# Patient Record
Sex: Female | Born: 1995 | Race: Black or African American | Hispanic: No | Marital: Single | Smoking: Current every day smoker
Health system: Southern US, Community
[De-identification: ages and names within clinical notes are randomized; demographics above are authoritative.]

---
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
## 2016-03-27 ENCOUNTER — Encounter (HOSPITAL_BASED_OUTPATIENT_CLINIC_OR_DEPARTMENT_OTHER): Payer: Self-pay | Admitting: Emergency Medicine

## 2016-03-27 ENCOUNTER — Emergency Department (HOSPITAL_BASED_OUTPATIENT_CLINIC_OR_DEPARTMENT_OTHER)
Admission: EM | Admit: 2016-03-27 | Discharge: 2016-03-27 | Disposition: A | Discharge status: Home / Self Care | Payer: Self-pay | Attending: Emergency Medicine | Admitting: Emergency Medicine

## 2016-03-27 DIAGNOSIS — E860 Dehydration: Secondary | ICD-10-CM

## 2016-03-27 DIAGNOSIS — F17200 Nicotine dependence, unspecified, uncomplicated: Secondary | ICD-10-CM | POA: Insufficient documentation

## 2016-03-27 DIAGNOSIS — T5191XA Toxic effect of unspecified alcohol, accidental (unintentional), initial encounter: Secondary | ICD-10-CM

## 2016-03-27 LAB — PREGNANCY, URINE: Preg Test, Ur: NEGATIVE

## 2016-03-27 MED ORDER — KETOROLAC TROMETHAMINE 30 MG/ML IJ SOLN
30.0000 mg | Freq: Once | INTRAMUSCULAR | Status: AC
  Administered 2016-03-27: 30 mg via INTRAVENOUS
  Filled 2016-03-27: qty 1

## 2016-03-27 MED ORDER — SODIUM CHLORIDE 0.9 % IV BOLUS (SEPSIS)
1000.0000 mL | Freq: Once | INTRAVENOUS | Status: AC
  Administered 2016-03-27: 1000 mL via INTRAVENOUS

## 2016-03-27 MED ORDER — ONDANSETRON HCL 4 MG/2ML IJ SOLN
4.0000 mg | Freq: Once | INTRAMUSCULAR | Status: AC
  Administered 2016-03-27: 4 mg via INTRAVENOUS
  Filled 2016-03-27: qty 2
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
## 2016-03-27 NOTE — ED Provider Notes (Signed)
[facility]   CSN: [id number] Arrival date & time: 03/27/16  1414     History   Chief Complaint Chief Complaint  Patient presents with  . Weakness    HPI Susan Rosales is a 20 y.o. female.  The history is provided by the patient. No language interpreter was used.  Weakness  Primary symptoms include dizziness. This is a new problem. The current episode started 12 to 24 hours ago. The problem has been gradually worsening. There was no focality noted. There has been no fever. Pertinent negatives include no shortness of breath. There were no medications administered prior to arrival.  Pt complains of weakness after drinking to much alcohol last pm.  Pt reports she vomitted several times  History reviewed. No pertinent past medical history.  There are no active problems to display for this patient.   History reviewed. No pertinent surgical history.  OB History    No data available       Home Medications    Prior to Admission medications   Not on File    Family History History reviewed. No pertinent family history.  Social History Social History  Substance Use Topics  . Smoking status: Current Every Day Smoker  . Smokeless tobacco: Not on file  . Alcohol use Yes     Allergies   Review of patient's allergies indicates no known allergies.   Review of Systems Review of Systems  Respiratory: Negative for shortness of breath.   Neurological: Positive for dizziness and weakness.  All other systems reviewed and are negative.    Physical Exam Updated Vital Signs BP 124/75 (BP Location: Left Arm)   Pulse 81   Temp 98.1 F (36.7 C) (Oral)   Resp 18   Ht 5\' 5"  (1.651 m)   Wt 70.3 kg   LMP 03/10/2016 (Approximate)   SpO2 95%   BMI 25.79 kg/m   Physical Exam  Constitutional: She appears well-developed and well-nourished. No distress.  HENT:  Head: Normocephalic and atraumatic.  Eyes: Conjunctivae are normal.  Neck: Neck  supple.  Cardiovascular: Normal rate and regular rhythm.   No murmur heard. Pulmonary/Chest: Effort normal and breath sounds normal. No respiratory distress.  Abdominal: Soft. There is no tenderness.  Musculoskeletal: She exhibits no edema.  Neurological: She is alert.  Skin: Skin is warm and dry.  Psychiatric: She has a normal mood and affect.  Nursing note and vitals reviewed.    ED Treatments / Results  Labs (all labs ordered are listed, but only abnormal results are displayed) Labs Reviewed  PREGNANCY, URINE    EKG  EKG Interpretation None       Radiology No results found.  Procedures Procedures (including critical care time)  Medications Ordered in ED Medications  sodium chloride 0.9 % bolus 1,000 mL (not administered)  ondansetron (ZOFRAN) injection 4 mg (not administered)  ketorolac (TORADOL) 30 MG/ML injection 30 mg (not administered)     Initial Impression / Assessment and Plan / ED Course  I have reviewed the triage vital signs and the nursing notes.  Pertinent labs & imaging results that were available during my care of the patient were reviewed by me and considered in my medical decision making (see chart for details).  Clinical Course    Pt given iv fluids x 1 liter,  zofran and torodol.   Final Clinical Impressions(s) / ED Diagnoses   Final diagnoses:  Dehydration  Alcohol causing toxic effect, initial encounter    New  Prescriptions New Prescriptions   No medications on file     Elson Areas, [place] 03/27/16 1607    Jacalyn Lefevre, MD 03/28/16 [phone]
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
## 2016-03-27 NOTE — ED Notes (Signed)
Pt given ginger ale.

## 2016-08-11 ENCOUNTER — Emergency Department (HOSPITAL_BASED_OUTPATIENT_CLINIC_OR_DEPARTMENT_OTHER)
Admission: EM | Admit: 2016-08-11 | Discharge: 2016-08-12 | Disposition: A | Discharge status: Home / Self Care | Payer: Self-pay | Attending: Emergency Medicine | Admitting: Emergency Medicine

## 2016-08-11 ENCOUNTER — Encounter (HOSPITAL_BASED_OUTPATIENT_CLINIC_OR_DEPARTMENT_OTHER): Payer: Self-pay | Admitting: Emergency Medicine

## 2016-08-11 DIAGNOSIS — R05 Cough: Secondary | ICD-10-CM | POA: Insufficient documentation

## 2016-08-11 DIAGNOSIS — R1110 Vomiting, unspecified: Secondary | ICD-10-CM | POA: Insufficient documentation

## 2016-08-11 DIAGNOSIS — R69 Illness, unspecified: Secondary | ICD-10-CM

## 2016-08-11 DIAGNOSIS — R52 Pain, unspecified: Secondary | ICD-10-CM | POA: Insufficient documentation

## 2016-08-11 DIAGNOSIS — J111 Influenza due to unidentified influenza virus with other respiratory manifestations: Secondary | ICD-10-CM

## 2016-08-11 DIAGNOSIS — F17200 Nicotine dependence, unspecified, uncomplicated: Secondary | ICD-10-CM | POA: Insufficient documentation

## 2016-08-11 DIAGNOSIS — R509 Fever, unspecified: Secondary | ICD-10-CM | POA: Insufficient documentation

## 2016-08-11 DIAGNOSIS — R0981 Nasal congestion: Secondary | ICD-10-CM | POA: Insufficient documentation

## 2016-08-11 LAB — PREGNANCY, URINE: Preg Test, Ur: NEGATIVE

## 2016-08-11 LAB — URINALYSIS, MICROSCOPIC (REFLEX)

## 2016-08-11 LAB — URINALYSIS, ROUTINE W REFLEX MICROSCOPIC
Bilirubin Urine: NEGATIVE
GLUCOSE, UA: NEGATIVE mg/dL
HGB URINE DIPSTICK: NEGATIVE
Ketones, ur: NEGATIVE mg/dL
Nitrite: NEGATIVE
PH: 6.5 (ref 5.0–8.0)
Protein, ur: NEGATIVE mg/dL
SPECIFIC GRAVITY, URINE: 1.025 (ref 1.005–1.030)

## 2016-08-11 NOTE — ED Triage Notes (Signed)
Pt with cough, congestion, body aches, vomiting.

## 2016-08-11 NOTE — ED Notes (Signed)
Pt also states she had been having urinary frequency and is concerned she may have a UTI.

## 2016-08-12 MED ORDER — IBUPROFEN 800 MG PO TABS: 800.0000 mg | ORAL_TABLET | Freq: Three times a day (TID) | ORAL | 0 refills | Status: DC

## 2016-08-12 MED ORDER — BENZONATATE 100 MG PO CAPS: 100.0000 mg | ORAL_CAPSULE | Freq: Three times a day (TID) | ORAL | 0 refills | Status: DC

## 2016-08-12 NOTE — ED Provider Notes (Signed)
[facility]   CSN: [id number] Arrival date & time: 08/11/16  2045     History   Chief Complaint Chief Complaint  Patient presents with  . URI    HPI Susan Rosales is a 21 y.o. female.  The history is provided by the patient.  URI   This is a new problem. The current episode started more than 2 days ago. The problem has been gradually worsening. The maximum temperature recorded prior to her arrival was 100 to 100.9 F. Pertinent negatives include no chest pain and no diarrhea. She has tried nothing for the symptoms. The treatment provided no relief.  Pt concerned about uti as well  History reviewed. No pertinent past medical history.  There are no active problems to display for this patient.   History reviewed. No pertinent surgical history.  OB History    No data available       Home Medications    Prior to Admission medications   Medication Sig Start Date End Date Taking? Authorizing Provider  benzonatate (TESSALON) 100 MG capsule Take 1 capsule (100 mg total) by mouth every 8 (eight) hours. 08/12/16   Elson Areas, PA-C  ibuprofen (ADVIL,MOTRIN) 800 MG tablet Take 1 tablet (800 mg total) by mouth 3 (three) times daily. 08/12/16   Elson Areas, PA-C    Family History No family history on file.  Social History Social History  Substance Use Topics  . Smoking status: Current Every Day Smoker  . Smokeless tobacco: Never Used  . Alcohol use Yes     Allergies   Patient has no known allergies.   Review of Systems Review of Systems  Cardiovascular: Negative for chest pain.  Gastrointestinal: Negative for diarrhea.  All other systems reviewed and are negative.    Physical Exam Updated Vital Signs BP 113/77   Pulse 83   Temp 98.2 F (36.8 C)   Resp 18   Ht 5\' 5"  (1.651 m)   Wt 65.8 kg   SpO2 99%   BMI 24.13 kg/m   Physical Exam  Constitutional: She appears well-developed and well-nourished. No distress.  HENT:    Head: Normocephalic and atraumatic.  Right Ear: External ear normal.  Left Ear: External ear normal.  Nose: Nose normal.  Mouth/Throat: Oropharynx is clear and moist.  Eyes: Conjunctivae are normal.  Neck: Neck supple.  Cardiovascular: Normal rate and regular rhythm.   No murmur heard. Pulmonary/Chest: Effort normal and breath sounds normal. No respiratory distress.  Abdominal: Soft. There is no tenderness.  Musculoskeletal: Normal range of motion. She exhibits no edema.  Neurological: She is alert.  Skin: Skin is warm and dry.  Psychiatric: She has a normal mood and affect.  Nursing note and vitals reviewed.    ED Treatments / Results  Labs (all labs ordered are listed, but only abnormal results are displayed) Labs Reviewed  URINALYSIS, ROUTINE W REFLEX MICROSCOPIC - Abnormal; Notable for the following:       Result Value   Leukocytes, UA TRACE (*)    All other components within normal limits  URINALYSIS, MICROSCOPIC (REFLEX) - Abnormal; Notable for the following:    Bacteria, UA MANY (*)    Squamous Epithelial / LPF 6-30 (*)    All other components within normal limits  PREGNANCY, URINE    EKG  EKG Interpretation None       Radiology No results found.  Procedures Procedures (including critical care time)  Medications Ordered in ED Medications -  No data to display   Initial Impression / Assessment and Plan / ED Course  I have reviewed the triage vital signs and the nursing notes.  Pertinent labs & imaging results that were available during my care of the patient were reviewed by me and considered in my medical decision making (see chart for details).       Final Clinical Impressions(s) / ED Diagnoses   Final diagnoses:  Influenza-like illness    New Prescriptions Discharge Medication List as of 08/12/2016 12:42 AM    START taking these medications   Details  benzonatate (TESSALON) 100 MG capsule Take 1 capsule (100 mg total) by mouth every 8  (eight) hours., Starting Thu 08/12/2016, Print    ibuprofen (ADVIL,MOTRIN) 800 MG tablet Take 1 tablet (800 mg total) by mouth 3 (three) times daily., Starting Thu 08/12/2016, Print      An After Visit Summary was printed and given to the patient.   Lonia SkinnerLeslie K CaneySofia, PA-C 08/12/16 0159    Paula LibraJohn Molpus, MD 08/12/16 [phone]

## 2016-08-12 NOTE — ED Notes (Signed)
ED Provider at bedside. 

## 2017-01-21 ENCOUNTER — Encounter (HOSPITAL_BASED_OUTPATIENT_CLINIC_OR_DEPARTMENT_OTHER): Payer: Self-pay | Admitting: *Deleted

## 2017-01-21 ENCOUNTER — Emergency Department (HOSPITAL_BASED_OUTPATIENT_CLINIC_OR_DEPARTMENT_OTHER)
Admission: EM | Admit: 2017-01-21 | Discharge: 2017-01-21 | Disposition: A | Discharge status: Home / Self Care | Payer: Self-pay | Attending: Emergency Medicine | Admitting: Emergency Medicine

## 2017-01-21 DIAGNOSIS — K047 Periapical abscess without sinus: Secondary | ICD-10-CM | POA: Insufficient documentation

## 2017-01-21 DIAGNOSIS — Z79899 Other long term (current) drug therapy: Secondary | ICD-10-CM | POA: Insufficient documentation

## 2017-01-21 DIAGNOSIS — F17290 Nicotine dependence, other tobacco product, uncomplicated: Secondary | ICD-10-CM | POA: Insufficient documentation

## 2017-01-21 DIAGNOSIS — K029 Dental caries, unspecified: Secondary | ICD-10-CM | POA: Insufficient documentation

## 2017-01-21 MED ORDER — MELOXICAM 15 MG PO TABS: 15.0000 mg | ORAL_TABLET | Freq: Every day | ORAL | 0 refills | Status: DC

## 2017-01-21 MED ORDER — AMOXICILLIN 500 MG PO CAPS: 500.0000 mg | ORAL_CAPSULE | Freq: Three times a day (TID) | ORAL | 0 refills | Status: DC

## 2017-01-21 NOTE — Discharge Instructions (Signed)
Do not take the pain medicine if you are pregnant. Take tylenol.  You have been seen by your caregiver because of dental pain.  SEEK MEDICAL ATTENTION IF: The exam and treatment you received today has been provided on an emergency basis only. This is not a substitute for complete medical or dental care. If your problem worsens or new symptoms (problems) appear, and you are unable to arrange prompt follow-up care with your dentist, call or return to this location. CALL YOUR DENTIST OR RETURN IMMEDIATELY IF you develop a fever, rash, difficulty breathing or swallowing, neck or facial swelling, or other potentially serious concerns.

## 2017-01-21 NOTE — ED Triage Notes (Signed)
Pt c/o dental pain x 3 months.   

## 2017-01-21 NOTE — ED Notes (Signed)
ED Provider at bedside. 

## 2017-01-21 NOTE — ED Provider Notes (Signed)
[facility]   CSN: [id number] Arrival date & time: 01/21/17  1623  By signing my name below, I, Rosana Fretana Waskiewicz, attest that this documentation has been prepared under the direction and in the presence of Arthor CaptainAbigail Zyron Deeley, PA-C.  Electronically Signed: Rosana Fretana Waskiewicz, ED Scribe. 01/21/17. 5:25 PM.  History   Chief Complaint Chief Complaint  Patient presents with  . Dental Pain   The history is provided by the patient. No language interpreter was used.   HPI Comments: Susan Rosales is a 21 y.o. female who presents to the Emergency Department complaining of constant, gradually worsening pain to the left upper dental area onset 1 month ago. Pt has a hx of tobacco use. Pt denies trouble swallowing, fever, pain in the face or any other complaints at this time.  History reviewed. No pertinent past medical history.  There are no active problems to display for this patient.   History reviewed. No pertinent surgical history.  OB History    No data available       Home Medications    Prior to Admission medications   Medication Sig Start Date End Date Taking? Authorizing Provider  ibuprofen (ADVIL,MOTRIN) 800 MG tablet Take 1 tablet (800 mg total) by mouth 3 (three) times daily. 08/12/16  Yes Cheron SchaumannSofia, Leslie K, PA-C  benzonatate (TESSALON) 100 MG capsule Take 1 capsule (100 mg total) by mouth every 8 (eight) hours. 08/12/16   Elson AreasSofia, Leslie K, PA-C    Family History History reviewed. No pertinent family history.  Social History Social History  Substance Use Topics  . Smoking status: Current Every Day Smoker    Types: Cigars  . Smokeless tobacco: Never Used  . Alcohol use Yes     Allergies   Patient has no known allergies.   Review of Systems Review of Systems  Constitutional: Negative for fever.  HENT: Positive for dental problem. Negative for trouble swallowing.      Physical Exam Updated Vital Signs BP 121/81   Pulse 81   Temp 98.6 F (37  C) (Oral)   Resp 16   Ht 5\' 5"  (1.651 m)   Wt 145 lb (65.8 kg)   LMP 01/05/2017   SpO2 100%   BMI 24.13 kg/m   Physical Exam  Constitutional: She is oriented to person, place, and time. She appears well-developed and well-nourished.  HENT:  Head: Normocephalic.  Mouth/Throat: Uvula is midline and oropharynx is clear and moist. No tonsillar abscesses.    Eyes: EOM are normal.  Neck: Normal range of motion.  Cardiovascular: Normal rate.   Pulmonary/Chest: Effort normal.  Abdominal: She exhibits no distension.  Musculoskeletal: Normal range of motion.  Neurological: She is alert and oriented to person, place, and time.  Psychiatric: She has a normal mood and affect.  Nursing note and vitals reviewed.    ED Treatments / Results  DIAGNOSTIC STUDIES: Oxygen Saturation is 100% on RA, normal by my interpretation.   COORDINATION OF CARE: 5:08 PM-Discussed next steps with pt. Pt verbalized understanding and is agreeable with the plan.   Labs (all labs ordered are listed, but only abnormal results are displayed) Labs Reviewed - No data to display  EKG  EKG Interpretation None       Radiology No results found.  Procedures Procedures (including critical care time)  Medications Ordered in ED Medications - No data to display   Initial Impression / Assessment and Plan / ED Course  I have reviewed the triage vital signs and  the nursing notes.  Pertinent labs & imaging results that were available during my care of the patient were reviewed by me and considered in my medical decision making (see chart for details).       Patient with dentalgia.  No abscess requiring immediate incision and drainage.  Exam not concerning for Ludwig's angina or pharyngeal abscess. Pt instructed to follow-up with dentist. Discussed return precautions. Pt safe for discharge.   Final Clinical Impressions(s) / ED Diagnoses   Final diagnoses:  Pain due to dental caries  Dental  infection    New Prescriptions New Prescriptions   No medications on file   I personally performed the services described in this documentation, which was scribed in my presence. The recorded information has been reviewed and is accurate.        Arthor Captain, PA-C 01/21/17 1728    Maia Plan, MD 01/22/17 [phone]

## 2018-08-13 ENCOUNTER — Encounter (HOSPITAL_BASED_OUTPATIENT_CLINIC_OR_DEPARTMENT_OTHER): Payer: Self-pay

## 2018-08-13 ENCOUNTER — Emergency Department (HOSPITAL_BASED_OUTPATIENT_CLINIC_OR_DEPARTMENT_OTHER)
Admission: EM | Admit: 2018-08-13 | Discharge: 2018-08-13 | Disposition: A | Discharge status: Home / Self Care | Payer: Self-pay | Attending: Emergency Medicine | Admitting: Emergency Medicine

## 2018-08-13 ENCOUNTER — Emergency Department (HOSPITAL_BASED_OUTPATIENT_CLINIC_OR_DEPARTMENT_OTHER): Payer: Self-pay

## 2018-08-13 ENCOUNTER — Other Ambulatory Visit: Payer: Self-pay

## 2018-08-13 DIAGNOSIS — Y998 Other external cause status: Secondary | ICD-10-CM | POA: Insufficient documentation

## 2018-08-13 DIAGNOSIS — M25522 Pain in left elbow: Secondary | ICD-10-CM | POA: Insufficient documentation

## 2018-08-13 DIAGNOSIS — Y929 Unspecified place or not applicable: Secondary | ICD-10-CM | POA: Insufficient documentation

## 2018-08-13 DIAGNOSIS — X118XXA Contact with other hot tap-water, initial encounter: Secondary | ICD-10-CM | POA: Insufficient documentation

## 2018-08-13 DIAGNOSIS — F17290 Nicotine dependence, other tobacco product, uncomplicated: Secondary | ICD-10-CM | POA: Insufficient documentation

## 2018-08-13 DIAGNOSIS — Y9389 Activity, other specified: Secondary | ICD-10-CM | POA: Insufficient documentation

## 2018-08-13 DIAGNOSIS — T2112XA Burn of first degree of abdominal wall, initial encounter: Secondary | ICD-10-CM | POA: Insufficient documentation

## 2018-08-13 MED ORDER — HYDROCODONE-ACETAMINOPHEN 5-325 MG PO TABS
1.0000 | ORAL_TABLET | Freq: Once | ORAL | Status: AC
  Administered 2018-08-13: 1 via ORAL
  Filled 2018-08-13: qty 1

## 2018-08-13 MED ORDER — HYDROCODONE-ACETAMINOPHEN 5-325 MG PO TABS: 1.0000 | ORAL_TABLET | ORAL | 0 refills | Status: DC | PRN
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
## 2018-08-13 NOTE — ED Notes (Signed)
ED Provider at bedside. 

## 2018-08-13 NOTE — ED Notes (Signed)
Pt given ice to apply to left elbow
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
## 2018-08-13 NOTE — ED Notes (Signed)
Pt called out requesting update- pt informed she was next to be seen.

## 2018-08-13 NOTE — ED Notes (Signed)
Pt informed this RN she was going to leave. RN asked pt to remain in room and EDP would be in shortly. EDP informed.
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
## 2023-07-17 ENCOUNTER — Emergency Department (HOSPITAL_BASED_OUTPATIENT_CLINIC_OR_DEPARTMENT_OTHER)
Admission: EM | Admit: 2023-07-17 | Discharge: 2023-07-18 | Disposition: A | Discharge status: Home / Self Care | Payer: Self-pay | Attending: Emergency Medicine | Admitting: Emergency Medicine

## 2023-07-17 ENCOUNTER — Encounter (HOSPITAL_BASED_OUTPATIENT_CLINIC_OR_DEPARTMENT_OTHER): Payer: Self-pay | Admitting: Emergency Medicine

## 2023-07-17 ENCOUNTER — Other Ambulatory Visit: Payer: Self-pay

## 2023-07-17 DIAGNOSIS — K529 Noninfective gastroenteritis and colitis, unspecified: Secondary | ICD-10-CM | POA: Insufficient documentation

## 2023-07-17 DIAGNOSIS — Z20822 Contact with and (suspected) exposure to covid-19: Secondary | ICD-10-CM | POA: Insufficient documentation

## 2023-07-17 LAB — CBC WITH DIFFERENTIAL/PLATELET
Abs Immature Granulocytes: 0.01 10*3/uL (ref 0.00–0.07)
Basophils Absolute: 0 10*3/uL (ref 0.0–0.1)
Basophils Relative: 0 %
Eosinophils Absolute: 0.1 10*3/uL (ref 0.0–0.5)
Eosinophils Relative: 1 %
HCT: 39.9 % (ref 36.0–46.0)
Hemoglobin: 13.1 g/dL (ref 12.0–15.0)
Immature Granulocytes: 0 %
Lymphocytes Relative: 39 %
Lymphs Abs: 2.6 10*3/uL (ref 0.7–4.0)
MCH: 29.9 pg (ref 26.0–34.0)
MCHC: 32.8 g/dL (ref 30.0–36.0)
MCV: 91.1 fL (ref 80.0–100.0)
Monocytes Absolute: 0.4 10*3/uL (ref 0.1–1.0)
Monocytes Relative: 6 %
Neutro Abs: 3.6 10*3/uL (ref 1.7–7.7)
Neutrophils Relative %: 54 %
Platelets: 357 10*3/uL (ref 150–400)
RBC: 4.38 MIL/uL (ref 3.87–5.11)
RDW: 12.9 % (ref 11.5–15.5)
WBC: 6.7 10*3/uL (ref 4.0–10.5)
nRBC: 0 % (ref 0.0–0.2)

## 2023-07-17 LAB — COMPREHENSIVE METABOLIC PANEL
ALT: 20 U/L (ref 0–44)
AST: 21 U/L (ref 15–41)
Albumin: 4 g/dL (ref 3.5–5.0)
Alkaline Phosphatase: 57 U/L (ref 38–126)
Anion gap: 8 (ref 5–15)
BUN: 13 mg/dL (ref 6–20)
CO2: 25 mmol/L (ref 22–32)
Calcium: 9.2 mg/dL (ref 8.9–10.3)
Chloride: 103 mmol/L (ref 98–111)
Creatinine, Ser: 0.69 mg/dL (ref 0.44–1.00)
GFR, Estimated: >60 mL/min (ref >60)
Glucose, Bld: 100 mg/dL — ABNORMAL HIGH (ref 70–99)
Potassium: 3.6 mmol/L (ref 3.5–5.1)
Sodium: 136 mmol/L (ref 135–145)
Total Bilirubin: 0.4 mg/dL (ref <1.2)
Total Protein: 6.8 g/dL (ref 6.5–8.1)

## 2023-07-17 LAB — RESP PANEL BY RT-PCR (RSV, FLU A&B, COVID)  RVPGX2
Influenza A by PCR: NEGATIVE
Influenza B by PCR: NEGATIVE
Resp Syncytial Virus by PCR: NEGATIVE
SARS Coronavirus 2 by RT PCR: NEGATIVE

## 2023-07-17 MED ORDER — ONDANSETRON 4 MG PO TBDP
4.0000 mg | ORAL_TABLET | Freq: Once | ORAL | Status: AC
  Administered 2023-07-18: 4 mg via ORAL
  Filled 2023-07-17: qty 1

## 2023-07-17 NOTE — ED Provider Notes (Signed)
[place] EMERGENCY DEPARTMENT AT [facility]  Provider Note  CSN: [id number] Arrival date & time: 07/17/23 2104  History Chief Complaint  Patient presents with   Emesis    Susan Rosales is a 27 y.o. female with no significant PMH reports 2 days of nausea, vomiting and body aches. No abdominal pain. Had some diarrhea initially, but none since. No fevers. No dysuria. Does not think she is pregnant.    Home Medications Prior to Admission medications   Medication Sig Start Date End Date Taking? Authorizing Provider  ondansetron (ZOFRAN-ODT) 4 MG disintegrating tablet Take 1 tablet (4 mg total) by mouth every 8 (eight) hours as needed for nausea or vomiting. 07/18/23  Yes Pollyann Savoy, MD     Allergies    Patient has no known allergies.   Review of Systems   Review of Systems Please see HPI for pertinent positives and negatives  Physical Exam BP (!) 125/103   Pulse 65   Temp 97.6 F (36.4 C)   Resp 17   Ht 5\' 5"  (1.651 m)   Wt 66.7 kg   LMP 07/12/2023   SpO2 100%   BMI 24.46 kg/m   Physical Exam Vitals and nursing note reviewed.  Constitutional:      Appearance: Normal appearance.  HENT:     Head: Normocephalic and atraumatic.     Nose: Nose normal.     Mouth/Throat:     Mouth: Mucous membranes are moist.  Eyes:     Extraocular Movements: Extraocular movements intact.     Conjunctiva/sclera: Conjunctivae normal.  Cardiovascular:     Rate and Rhythm: Normal rate.  Pulmonary:     Effort: Pulmonary effort is normal.     Breath sounds: Normal breath sounds.  Abdominal:     General: Abdomen is flat.     Palpations: Abdomen is soft.     Tenderness: There is no abdominal tenderness. There is no guarding.  Musculoskeletal:        General: No swelling. Normal range of motion.     Cervical back: Neck supple.  Skin:    General: Skin is warm and dry.  Neurological:     General: No focal deficit present.     Mental Status: She is alert.   Psychiatric:        Mood and Affect: Mood normal.     ED Results / Procedures / Treatments   EKG None  Procedures Procedures  Medications Ordered in the ED Medications  ondansetron (ZOFRAN-ODT) disintegrating tablet 4 mg (4 mg Oral Given 07/18/23 0018)    Initial Impression and Plan  Patient here with N/V/D in the last 2 days. Exam and vitals are reassuring. Labs done in triage showed normal CBC, CMP and Covid/Flu/RSV swab. UA/HCG is pending. Will give oral antiemetics and PO trial. Likely has a viral syndrome, no concern for acute surgical process.   ED Course   Clinical Course as of 07/18/23 0049  Mon Jul 18, 2023  0027 HCG is neg.  [CS]  0036 UA is clear.  [CS]  [phone] Patient tolerating PO Fluids, feeling better and ready go to home. Rx for Zofran, recommend she advance diet as tolerated and PCP follow up, RTED for any other concerns.   [CS]    Clinical Course User Index [CS] Pollyann Savoy, MD     MDM Rules/Calculators/A&P Medical Decision Making Problems Addressed: Gastroenteritis: acute illness or injury  Amount and/or Complexity of Data Reviewed Labs: ordered. Decision-making details  documented in ED Course.  Risk Prescription drug management.     Final Clinical Impression(s) / ED Diagnoses Final diagnoses:  Gastroenteritis    Rx / DC Orders ED Discharge Orders          Ordered    ondansetron (ZOFRAN-ODT) 4 MG disintegrating tablet  Every 8 hours PRN        07/18/23 0049             Pollyann Savoy, MD 07/18/23 [phone]

## 2023-07-17 NOTE — ED Triage Notes (Signed)
Pt c/o NV and body aches since Friday

## 2023-07-18 LAB — PREGNANCY, URINE: Preg Test, Ur: NEGATIVE

## 2023-07-18 LAB — URINALYSIS, ROUTINE W REFLEX MICROSCOPIC
Bilirubin Urine: NEGATIVE
Glucose, UA: NEGATIVE mg/dL
Hgb urine dipstick: NEGATIVE
Ketones, ur: NEGATIVE mg/dL
Leukocytes,Ua: NEGATIVE
Nitrite: NEGATIVE
Protein, ur: NEGATIVE mg/dL
Specific Gravity, Urine: 1.02 (ref 1.005–1.030)
pH: 7.5 (ref 5.0–8.0)

## 2023-07-18 MED ORDER — ONDANSETRON 4 MG PO TBDP: 4.0000 mg | ORAL_TABLET | Freq: Three times a day (TID) | ORAL | 0 refills | Status: DC | PRN

## 2024-01-22 ENCOUNTER — Emergency Department (HOSPITAL_BASED_OUTPATIENT_CLINIC_OR_DEPARTMENT_OTHER)
Admission: EM | Admit: 2024-01-22 | Discharge: 2024-01-22 | Disposition: A | Discharge status: Home / Self Care | Payer: Self-pay

## 2024-01-22 ENCOUNTER — Other Ambulatory Visit: Payer: Self-pay

## 2024-01-22 ENCOUNTER — Encounter (HOSPITAL_BASED_OUTPATIENT_CLINIC_OR_DEPARTMENT_OTHER): Payer: Self-pay

## 2024-01-22 DIAGNOSIS — K529 Noninfective gastroenteritis and colitis, unspecified: Secondary | ICD-10-CM | POA: Insufficient documentation

## 2024-01-22 DIAGNOSIS — R5381 Other malaise: Secondary | ICD-10-CM | POA: Insufficient documentation

## 2024-01-22 DIAGNOSIS — R531 Weakness: Secondary | ICD-10-CM | POA: Insufficient documentation

## 2024-01-22 LAB — CBC
HCT: 41.8 % (ref 36.0–46.0)
Hemoglobin: 14.1 g/dL (ref 12.0–15.0)
MCH: 29.7 pg (ref 26.0–34.0)
MCHC: 33.7 g/dL (ref 30.0–36.0)
MCV: 88.2 fL (ref 80.0–100.0)
Platelets: 359 K/uL (ref 150–400)
RBC: 4.74 MIL/uL (ref 3.87–5.11)
RDW: 12.5 % (ref 11.5–15.5)
WBC: 5.4 K/uL (ref 4.0–10.5)
nRBC: 0 % (ref 0.0–0.2)

## 2024-01-22 LAB — URINALYSIS, ROUTINE W REFLEX MICROSCOPIC
Glucose, UA: NEGATIVE mg/dL
Hgb urine dipstick: NEGATIVE
Ketones, ur: 40 mg/dL — AB
Leukocytes,Ua: NEGATIVE
Nitrite: NEGATIVE
Protein, ur: 30 mg/dL — AB
Specific Gravity, Urine: >=1.03 (ref 1.005–1.030)
pH: 6 (ref 5.0–8.0)

## 2024-01-22 LAB — URINALYSIS, MICROSCOPIC (REFLEX)

## 2024-01-22 LAB — COMPREHENSIVE METABOLIC PANEL WITH GFR
ALT: 16 U/L (ref 0–44)
AST: 22 U/L (ref 15–41)
Albumin: 4.7 g/dL (ref 3.5–5.0)
Alkaline Phosphatase: 76 U/L (ref 38–126)
Anion gap: 14 (ref 5–15)
BUN: 10 mg/dL (ref 6–20)
CO2: 23 mmol/L (ref 22–32)
Calcium: 9.6 mg/dL (ref 8.9–10.3)
Chloride: 104 mmol/L (ref 98–111)
Creatinine, Ser: 0.86 mg/dL (ref 0.44–1.00)
GFR, Estimated: >60 mL/min (ref >60)
Glucose, Bld: 107 mg/dL — ABNORMAL HIGH (ref 70–99)
Potassium: 3.6 mmol/L (ref 3.5–5.1)
Sodium: 142 mmol/L (ref 135–145)
Total Bilirubin: 0.6 mg/dL (ref 0.0–1.2)
Total Protein: 7.7 g/dL (ref 6.5–8.1)

## 2024-01-22 LAB — PREGNANCY, URINE: Preg Test, Ur: NEGATIVE

## 2024-01-22 LAB — LIPASE, BLOOD: Lipase: 23 U/L (ref 11–51)

## 2024-01-22 MED ORDER — ONDANSETRON HCL 4 MG/2ML IJ SOLN
4.0000 mg | Freq: Once | INTRAMUSCULAR | Status: AC
  Administered 2024-01-22: 4 mg via INTRAVENOUS
  Filled 2024-01-22: qty 2

## 2024-01-22 MED ORDER — ONDANSETRON 4 MG PO TBDP
4.0000 mg | ORAL_TABLET | Freq: Three times a day (TID) | ORAL | 0 refills | Status: AC | PRN
Start: 2024-01-22 — End: ?

## 2024-01-22 MED ORDER — PROMETHAZINE HCL 25 MG RE SUPP
25.0000 mg | Freq: Four times a day (QID) | RECTAL | 0 refills | Status: AC | PRN
Start: 2024-01-22 — End: ?

## 2024-01-22 MED ORDER — CEPHALEXIN 500 MG PO CAPS: 500.0000 mg | ORAL_CAPSULE | Freq: Four times a day (QID) | ORAL | 0 refills | Status: AC

## 2024-01-22 NOTE — Discharge Instructions (Signed)
 We are prescribing you nausea medications to help with your symptoms.  Please try to maintain an hydration with frequent sips of clear liquids or low sugar Gatorade.  Please follow-up with your primary doctor.  It does appear that you have some evidence of urinary tract infection, we are prescribing antibiotics.  Please take them as prescribed.

## 2024-01-22 NOTE — ED Triage Notes (Signed)
 C/o vomiting and diarrhea since Saturday, unable to keep anything down.

## 2024-01-22 NOTE — ED Provider Notes (Signed)
 [place] EM[facility]ENCY DEPARTMENT AT [facility] Provider Note   CSN: [id number] Arrival date & time: 01/22/24  1249     Patient presents with: Vomiting   Emmett Cosner is a 28 y.o. female.   28 year old female presenting emergency department with nausea vomiting diarrhea for the past several days.  Reports some generalized weakness and malaise.  Reports some intermittent lower  abdominal cramping, but not having current abdominal pain.  No headache, vision changes, chest pain, shortness of breath, no dysuria.        Prior to Admission medications   Medication Sig Start Date End Date Taking? Authorizing Provider  ondansetron  (ZOFRAN -ODT) 4 MG disintegrating tablet Take 1 tablet (4 mg total) by mouth every 8 (eight) hours as needed for nausea or vomiting. 07/18/23   Roselyn Carlin NOVAK, MD    Allergies: Patient has no known allergies.    Review of Systems  Updated Vital Signs BP 118/86 (BP Location: Left Arm)   Pulse 95   Temp 98.1 F (36.7 C) (Oral)   Resp 16   Ht 5' 5 (1.651 m)   Wt 63.5 kg   LMP 01/18/2024 (Approximate)   SpO2 100%   BMI 23.30 kg/m   Physical Exam Vitals and nursing note reviewed.  Constitutional:      General: She is not in acute distress.    Appearance: She is not toxic-appearing.  HENT:     Head: Normocephalic.     Mouth/Throat:     Mouth: Mucous membranes are moist.     Pharynx: Oropharynx is clear.  Eyes:     Conjunctiva/sclera: Conjunctivae normal.     Pupils: Pupils are equal, round, and reactive to light.  Cardiovascular:     Rate and Rhythm: Normal rate and regular rhythm.  Pulmonary:     Effort: Pulmonary effort is normal.     Breath sounds: Normal breath sounds.  Abdominal:     General: Abdomen is flat. There is no distension.     Tenderness: There is no abdominal tenderness. There is no guarding or rebound.  Skin:    General: Skin is warm and dry.     Capillary Refill: Capillary refill takes less than 2 seconds.   Neurological:     Mental Status: She is alert and oriented to person, place, and time.  Psychiatric:        Mood and Affect: Mood normal.        Behavior: Behavior normal.     (all labs ordered are listed, but only abnormal results are displayed) Labs Reviewed  COMPREHENSIVE METABOLIC PANEL WITH GFR - Abnormal; Notable for the following components:      Result Value   Glucose, Bld 107 (*)    All other components within normal limits  LIPASE, BLOOD  CBC  URINALYSIS, ROUTINE W REFLEX MICROSCOPIC  PREGNANCY, URINE    EKG: None  Radiology: No results found.   Procedures   Medications Ordered in the ED  ondansetron  (ZOFRAN ) injection 4 mg (has no administration in time range)                                    Medical Decision Making Is a well-appearing 28 year old female presenting emergency department for nausea vomiting diarrhea.  She is afebrile nontachycardic, hemodynamically stable.  Strongly suspect viral etiology.  Clinically nontoxic, benign abdominal exam.  No sign of dehydration clinically or with labs.  Normal electrolytes.  No elevated BUN and creatinine to suggest dehydration.  No transaminitis to suggest hepatobiliary disease.  Lipase normal.  No leukocytosis to suggest systemic infection.  No anemia.  Considered CT of the abdomen, however given normal vital signs, reassuring exam and labs that the risk of radiation outweighs the benefit at this time.  Discussed supportive care.  Agreeable to plan.  Will discharge in stable condition.  UA is pending.  If positive will discharge with antibiotics.  Amount and/or Complexity of Data Reviewed Labs: ordered.  Risk Prescription drug management.      Final diagnoses:  None    ED Discharge Orders     None          Neysa Caron PARAS, DO 01/22/24 1448
